# Patient Record
Sex: Male | Born: 1995 | Race: Black or African American | Hispanic: No | Marital: Single | Smoking: Current every day smoker
Health system: Southern US, Community
[De-identification: ages and names within clinical notes are randomized; demographics above are authoritative.]

## PROBLEM LIST (undated history)

## (undated) DIAGNOSIS — B182 Chronic viral hepatitis C: Secondary | ICD-10-CM

## (undated) DIAGNOSIS — E119 Type 2 diabetes mellitus without complications: Secondary | ICD-10-CM

## (undated) DIAGNOSIS — IMO0002 Reserved for concepts with insufficient information to code with codable children: Secondary | ICD-10-CM

## (undated) HISTORY — PX: TONSILLECTOMY: SUR1361

## (undated) HISTORY — PX: LEG SURGERY: SHX1003

---

## 2014-08-25 ENCOUNTER — Emergency Department (HOSPITAL_COMMUNITY)
Admission: EM | Admit: 2014-08-25 | Discharge: 2014-08-25 | Disposition: A | Discharge status: Home / Self Care | Payer: Medicaid Other | Attending: Emergency Medicine | Admitting: Emergency Medicine

## 2014-08-25 ENCOUNTER — Emergency Department (HOSPITAL_COMMUNITY): Payer: Medicaid Other

## 2014-08-25 ENCOUNTER — Encounter (HOSPITAL_COMMUNITY): Payer: Self-pay | Admitting: Emergency Medicine

## 2014-08-25 DIAGNOSIS — F17200 Nicotine dependence, unspecified, uncomplicated: Secondary | ICD-10-CM | POA: Insufficient documentation

## 2014-08-25 DIAGNOSIS — Y9239 Other specified sports and athletic area as the place of occurrence of the external cause: Secondary | ICD-10-CM | POA: Diagnosis not present

## 2014-08-25 DIAGNOSIS — Y9351 Activity, roller skating (inline) and skateboarding: Secondary | ICD-10-CM | POA: Insufficient documentation

## 2014-08-25 DIAGNOSIS — Y92838 Other recreation area as the place of occurrence of the external cause: Secondary | ICD-10-CM

## 2014-08-25 DIAGNOSIS — X500XXA Overexertion from strenuous movement or load, initial encounter: Secondary | ICD-10-CM | POA: Insufficient documentation

## 2014-08-25 DIAGNOSIS — IMO0002 Reserved for concepts with insufficient information to code with codable children: Secondary | ICD-10-CM | POA: Insufficient documentation

## 2014-08-25 DIAGNOSIS — Z880 Allergy status to penicillin: Secondary | ICD-10-CM | POA: Insufficient documentation

## 2014-08-25 DIAGNOSIS — S99929A Unspecified injury of unspecified foot, initial encounter: Secondary | ICD-10-CM

## 2014-08-25 DIAGNOSIS — S8991XA Unspecified injury of right lower leg, initial encounter: Secondary | ICD-10-CM

## 2014-08-25 DIAGNOSIS — S99919A Unspecified injury of unspecified ankle, initial encounter: Secondary | ICD-10-CM

## 2014-08-25 DIAGNOSIS — W19XXXA Unspecified fall, initial encounter: Secondary | ICD-10-CM

## 2014-08-25 DIAGNOSIS — S8990XA Unspecified injury of unspecified lower leg, initial encounter: Secondary | ICD-10-CM | POA: Insufficient documentation

## 2014-08-25 HISTORY — DX: Reserved for concepts with insufficient information to code with codable children: IMO0002

## 2014-08-25 MED ORDER — TRAMADOL HCL 50 MG PO TABS
50.0000 mg | ORAL_TABLET | Freq: Once | ORAL | Status: AC
  Administered 2014-08-25: 50 mg via ORAL
  Filled 2014-08-25: qty 1

## 2014-08-25 MED ORDER — TRAMADOL HCL 50 MG PO TABS: 50.0000 mg | ORAL_TABLET | Freq: Four times a day (QID) | ORAL | Status: AC | PRN

## 2014-08-25 NOTE — Progress Notes (Signed)
[facility] [employer],  Provided pt with a list of self-pay providers, highlighting [facility].
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
## 2014-08-25 NOTE — ED Notes (Signed)
Bed: WTR7 Expected date:  Expected time:  Means of arrival:  Comments: Ems, 18yo male, knee pain

## 2014-08-25 NOTE — ED Notes (Signed)
Per [facility]- Pt fell off skateboard onto rt knee. NO LOC. Denies neck or back pain. Only complaints was rt knee pain. Pt ambulatory on scene. Swelling with slight abrasion to knee. Tender to palpation. No splinting only elevation and ice given.

## 2014-08-25 NOTE — Discharge Instructions (Signed)
Take Tramadol as needed for pain. Rest, ice and elevate your knee. Refer to attached documents for more information.

## 2014-08-25 NOTE — ED Notes (Signed)
Pt is homeless requesting to be released before 8pm to retain placement at shelter.

## 2014-08-26 NOTE — ED Provider Notes (Signed)
Medical screening examination/treatment/procedure(s) were performed by non-physician practitioner and as supervising physician I was immediately available for consultation/collaboration.   EKG Interpretation None        Rolland Porter, MD 08/26/14 1121

## 2014-12-14 ENCOUNTER — Emergency Department (HOSPITAL_COMMUNITY)
Admission: EM | Admit: 2014-12-14 | Discharge: 2014-12-14 | Disposition: A | Discharge status: Home / Self Care | Payer: Medicaid Other | Attending: Emergency Medicine | Admitting: Emergency Medicine

## 2015-05-05 ENCOUNTER — Encounter (HOSPITAL_COMMUNITY): Payer: Self-pay | Admitting: Emergency Medicine

## 2015-05-05 ENCOUNTER — Emergency Department (HOSPITAL_COMMUNITY)
Admission: EM | Admit: 2015-05-05 | Discharge: 2015-05-05 | Disposition: A | Discharge status: Home / Self Care | Payer: Medicaid Other | Attending: Emergency Medicine | Admitting: Emergency Medicine

## 2015-05-05 DIAGNOSIS — Z202 Contact with and (suspected) exposure to infections with a predominantly sexual mode of transmission: Secondary | ICD-10-CM | POA: Insufficient documentation

## 2015-05-05 DIAGNOSIS — Z87828 Personal history of other (healed) physical injury and trauma: Secondary | ICD-10-CM | POA: Insufficient documentation

## 2015-05-05 DIAGNOSIS — Z720 Tobacco use: Secondary | ICD-10-CM | POA: Insufficient documentation

## 2015-05-05 DIAGNOSIS — A64 Unspecified sexually transmitted disease: Secondary | ICD-10-CM

## 2015-05-05 DIAGNOSIS — Z880 Allergy status to penicillin: Secondary | ICD-10-CM | POA: Insufficient documentation

## 2015-05-05 MED ORDER — CEFTRIAXONE SODIUM 250 MG IJ SOLR
250.0000 mg | Freq: Once | INTRAMUSCULAR | Status: AC
  Administered 2015-05-05: 250 mg via INTRAMUSCULAR
  Filled 2015-05-05: qty 250

## 2015-05-05 MED ORDER — AZITHROMYCIN 250 MG PO TABS
1000.0000 mg | ORAL_TABLET | Freq: Once | ORAL | Status: AC
  Administered 2015-05-05: 1000 mg via ORAL
  Filled 2015-05-05: qty 4
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
## 2015-05-05 NOTE — ED Notes (Signed)
Pt st's he needs to be checked for STD.  C/o discharge from penis this am

## 2015-05-05 NOTE — Discharge Instructions (Signed)
Sexually Transmitted Disease A sexually transmitted disease (STD) is a disease or infection often passed to another person during sex. However, STDs can be passed through nonsexual ways. An STD can be passed through:  Spit (saliva).  Semen.  Blood.  Mucus from the vagina.  Pee (urine). HOW CAN I LESSEN MY CHANCES OF GETTING AN STD?  Use:  Latex condoms.  Water-soluble lubricants with condoms. Do not use petroleum jelly or oils.  Dental dams. These are small pieces of latex that are used as a barrier during oral sex.  Avoid having more than one sex partner.  Do not have sex with someone who has other sex partners.  Do not have sex with anyone you do not know or who is at high risk for an STD.  Avoid risky sex that can break your skin.  Do not have sex if you have open sores on your mouth or skin.  Avoid drinking too much alcohol or taking illegal drugs. Alcohol and drugs can affect your good judgment.  Avoid oral and anal sex acts.  Get shots (vaccines) for HPV and hepatitis.  If you are at risk of being infected with HIV, it is advised that you take a certain medicine daily to prevent HIV infection. This is called pre-exposure prophylaxis (PrEP). You may be at risk if:  You are a man who has sex with other men (MSM).  You are attracted to the opposite sex (heterosexual) and are having sex with more than one partner.  You take drugs with a needle.  You have sex with someone who has HIV.  Talk with your doctor about if you are at high risk of being infected with HIV. If you begin to take PrEP, get tested for HIV first. Get tested every 3 months for as long as you are taking PrEP. WHAT SHOULD I DO IF I THINK I HAVE AN STD?  See your doctor.  Tell your sex partner(s) that you have an STD. They should be tested and treated.  Do not have sex until your doctor says it is okay. WHEN SHOULD I GET HELP? Get help right away if:  You have bad belly (abdominal)  pain.  You are a man and have puffiness (swelling) or pain in your testicles.  You are a woman and have puffiness in your vagina. Document Released: 12/26/2004 Document Revised: 11/23/2013 Document Reviewed: 05/14/2013 ExitCare Patient Information 2015 [employer]. This information is not intended to replace advice given to you by your health care provider. Make sure you discuss any questions you have with your health care provider.  

## 2015-05-06 LAB — HIV ANTIBODY (ROUTINE TESTING W REFLEX): HIV Screen 4th Generation wRfx: NONREACTIVE

## 2015-05-06 LAB — RPR: RPR: NONREACTIVE

## 2015-08-05 ENCOUNTER — Encounter (HOSPITAL_COMMUNITY): Payer: Self-pay | Admitting: Emergency Medicine

## 2015-08-05 ENCOUNTER — Emergency Department (HOSPITAL_COMMUNITY)
Admission: EM | Admit: 2015-08-05 | Discharge: 2015-08-05 | Disposition: A | Discharge status: Home / Self Care | Payer: Medicaid Other | Attending: Emergency Medicine | Admitting: Emergency Medicine

## 2015-08-05 DIAGNOSIS — Z8619 Personal history of other infectious and parasitic diseases: Secondary | ICD-10-CM | POA: Insufficient documentation

## 2015-08-05 DIAGNOSIS — Z880 Allergy status to penicillin: Secondary | ICD-10-CM | POA: Insufficient documentation

## 2015-08-05 DIAGNOSIS — R740 Nonspecific elevation of levels of transaminase and lactic acid dehydrogenase [LDH]: Secondary | ICD-10-CM | POA: Insufficient documentation

## 2015-08-05 DIAGNOSIS — R110 Nausea: Secondary | ICD-10-CM | POA: Insufficient documentation

## 2015-08-05 DIAGNOSIS — Z720 Tobacco use: Secondary | ICD-10-CM | POA: Insufficient documentation

## 2015-08-05 DIAGNOSIS — E119 Type 2 diabetes mellitus without complications: Secondary | ICD-10-CM | POA: Insufficient documentation

## 2015-08-05 DIAGNOSIS — R1011 Right upper quadrant pain: Secondary | ICD-10-CM | POA: Insufficient documentation

## 2015-08-05 DIAGNOSIS — R7401 Elevation of levels of liver transaminase levels: Secondary | ICD-10-CM

## 2015-08-05 HISTORY — DX: Chronic viral hepatitis C: B18.2

## 2015-08-05 HISTORY — DX: Type 2 diabetes mellitus without complications: E11.9

## 2015-08-05 LAB — COMPREHENSIVE METABOLIC PANEL
ALT: 191 U/L — ABNORMAL HIGH (ref 17–63)
AST: 90 U/L — AB (ref 15–41)
Albumin: 4.2 g/dL (ref 3.5–5.0)
Alkaline Phosphatase: 79 U/L (ref 38–126)
Anion gap: 5 (ref 5–15)
BUN: 14 mg/dL (ref 6–20)
CALCIUM: 9.6 mg/dL (ref 8.9–10.3)
CHLORIDE: 103 mmol/L (ref 101–111)
CO2: 27 mmol/L (ref 22–32)
Creatinine, Ser: 0.93 mg/dL (ref 0.61–1.24)
GFR calc Af Amer: >60 mL/min (ref >60)
GFR calc non Af Amer: >60 mL/min (ref >60)
Glucose, Bld: 113 mg/dL — ABNORMAL HIGH (ref 65–99)
Potassium: 3.9 mmol/L (ref 3.5–5.1)
SODIUM: 135 mmol/L (ref 135–145)
Total Bilirubin: 1 mg/dL (ref 0.3–1.2)
Total Protein: 7 g/dL (ref 6.5–8.1)

## 2015-08-05 LAB — URINALYSIS, ROUTINE W REFLEX MICROSCOPIC
Bilirubin Urine: NEGATIVE
GLUCOSE, UA: NEGATIVE mg/dL
HGB URINE DIPSTICK: NEGATIVE
Ketones, ur: NEGATIVE mg/dL
Nitrite: NEGATIVE
PH: 7 (ref 5.0–8.0)
PROTEIN: NEGATIVE mg/dL
Specific Gravity, Urine: 1.017 (ref 1.005–1.030)
Urobilinogen, UA: 1 mg/dL (ref 0.0–1.0)

## 2015-08-05 LAB — LIPASE, BLOOD: LIPASE: 20 U/L — AB (ref 22–51)

## 2015-08-05 LAB — CBC
HCT: 43.8 % (ref 39.0–52.0)
HEMOGLOBIN: 15 g/dL (ref 13.0–17.0)
MCH: 30.9 pg (ref 26.0–34.0)
MCHC: 34.2 g/dL (ref 30.0–36.0)
MCV: 90.3 fL (ref 78.0–100.0)
Platelets: 229 10*3/uL (ref 150–400)
RBC: 4.85 MIL/uL (ref 4.22–5.81)
RDW: 12.5 % (ref 11.5–15.5)
WBC: 5.7 10*3/uL (ref 4.0–10.5)

## 2015-08-05 LAB — URINE MICROSCOPIC-ADD ON

## 2015-08-05 MED ORDER — LIDOCAINE VISCOUS 2 % MT SOLN
15.0000 mL | Freq: Once | OROMUCOSAL | Status: AC
  Administered 2015-08-05: 15 mL via OROMUCOSAL
  Filled 2015-08-05: qty 15

## 2015-08-05 MED ORDER — ONDANSETRON HCL 4 MG/2ML IJ SOLN
4.0000 mg | Freq: Once | INTRAMUSCULAR | Status: DC
Start: 2015-08-05 — End: 2015-08-05

## 2015-08-05 MED ORDER — IBUPROFEN 800 MG PO TABS
800.0000 mg | ORAL_TABLET | Freq: Once | ORAL | Status: AC
  Administered 2015-08-05: 800 mg via ORAL
  Filled 2015-08-05: qty 1

## 2015-08-05 MED ORDER — ALUM & MAG HYDROXIDE-SIMETH 200-200-20 MG/5ML PO SUSP
15.0000 mL | Freq: Once | ORAL | Status: AC
  Administered 2015-08-05: 15 mL via ORAL
  Filled 2015-08-05: qty 30

## 2015-08-05 MED ORDER — ONDANSETRON 4 MG PO TBDP
4.0000 mg | ORAL_TABLET | Freq: Once | ORAL | Status: AC
  Administered 2015-08-05: 4 mg via ORAL
  Filled 2015-08-05: qty 1

## 2015-08-05 NOTE — ED Notes (Signed)
Pt c/o abdominal pain with nausea onset couple days ago. Pt has history of Hep C.
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
## 2015-08-05 NOTE — ED Notes (Signed)
Pt remains monitored by blood pressure and pulse ox. Pts family remains at bedside.  

## 2015-08-05 NOTE — ED Notes (Signed)
EDP at bedside  

## 2015-08-05 NOTE — Discharge Instructions (Signed)

## 2015-08-05 NOTE — ED Notes (Signed)
Patient does not need to urinate at this time.

## 2015-10-23 ENCOUNTER — Emergency Department (HOSPITAL_COMMUNITY)
Admission: EM | Admit: 2015-10-23 | Discharge: 2015-10-23 | Disposition: A | Discharge status: Home / Self Care | Payer: Medicaid Other | Attending: Emergency Medicine | Admitting: Emergency Medicine

## 2015-10-23 ENCOUNTER — Encounter (HOSPITAL_COMMUNITY): Payer: Self-pay | Admitting: Emergency Medicine

## 2015-10-23 DIAGNOSIS — F17210 Nicotine dependence, cigarettes, uncomplicated: Secondary | ICD-10-CM | POA: Insufficient documentation

## 2015-10-23 DIAGNOSIS — Z880 Allergy status to penicillin: Secondary | ICD-10-CM | POA: Insufficient documentation

## 2015-10-23 DIAGNOSIS — E119 Type 2 diabetes mellitus without complications: Secondary | ICD-10-CM | POA: Insufficient documentation

## 2015-10-23 DIAGNOSIS — Z202 Contact with and (suspected) exposure to infections with a predominantly sexual mode of transmission: Secondary | ICD-10-CM | POA: Insufficient documentation

## 2015-10-23 DIAGNOSIS — Z711 Person with feared health complaint in whom no diagnosis is made: Secondary | ICD-10-CM

## 2015-10-23 DIAGNOSIS — Z8619 Personal history of other infectious and parasitic diseases: Secondary | ICD-10-CM | POA: Insufficient documentation

## 2015-10-23 LAB — URINALYSIS, ROUTINE W REFLEX MICROSCOPIC
Bilirubin Urine: NEGATIVE
GLUCOSE, UA: NEGATIVE mg/dL
Hgb urine dipstick: NEGATIVE
KETONES UR: NEGATIVE mg/dL
NITRITE: NEGATIVE
PH: 7.5 (ref 5.0–8.0)
PROTEIN: NEGATIVE mg/dL
SPECIFIC GRAVITY, URINE: 1.021 (ref 1.005–1.030)

## 2015-10-23 LAB — URINE MICROSCOPIC-ADD ON

## 2015-10-23 MED ORDER — AZITHROMYCIN 250 MG PO TABS
1000.0000 mg | ORAL_TABLET | Freq: Once | ORAL | Status: AC
  Administered 2015-10-23: 1000 mg via ORAL
  Filled 2015-10-23: qty 4

## 2015-10-23 MED ORDER — METRONIDAZOLE 500 MG PO TABS
2000.0000 mg | ORAL_TABLET | Freq: Once | ORAL | Status: AC
  Administered 2015-10-23: 2000 mg via ORAL
  Filled 2015-10-23: qty 4

## 2015-10-23 MED ORDER — CEFTRIAXONE SODIUM 250 MG IJ SOLR
250.0000 mg | Freq: Once | INTRAMUSCULAR | Status: AC
  Administered 2015-10-23: 250 mg via INTRAMUSCULAR
  Filled 2015-10-23: qty 250

## 2015-10-23 MED ORDER — LIDOCAINE HCL (PF) 1 % IJ SOLN
INTRAMUSCULAR | Status: AC
  Administered 2015-10-23: 5 mL
  Filled 2015-10-23: qty 5

## 2015-10-23 NOTE — ED Notes (Signed)
Pt. reports yellow penile discharge onset 2 weeks ago , denies dysuria /no fever .

## 2015-10-23 NOTE — Discharge Instructions (Signed)
Sexually Transmitted Disease °A sexually transmitted disease (STD) is a disease or infection that may be passed (transmitted) from person to person, usually during sexual activity. This may happen by way of saliva, semen, blood, vaginal mucus, or urine. Common STDs include: °· Gonorrhea. °· Chlamydia. °· Syphilis. °· HIV and AIDS. °· Genital herpes. °· Hepatitis B and C. °· Trichomonas. °· Human papillomavirus (HPV). °· Pubic lice. °· Scabies. °· Mites. °· Bacterial vaginosis. °WHAT ARE CAUSES OF STDs? °An STD may be caused by bacteria, a virus, or parasites. STDs are often transmitted during sexual activity if one person is infected. However, they may also be transmitted through nonsexual means. STDs may be transmitted after:  °· Sexual intercourse with an infected person. °· Sharing sex toys with an infected person. °· Sharing needles with an infected person or using unclean piercing or tattoo needles. °· Having intimate contact with the genitals, mouth, or rectal areas of an infected person. °· Exposure to infected fluids during birth. °WHAT ARE THE SIGNS AND SYMPTOMS OF STDs? °Different STDs have different symptoms. Some people may not have any symptoms. If symptoms are present, they may include: °· Painful or bloody urination. °· Pain in the pelvis, abdomen, vagina, anus, throat, or eyes. °· A skin rash, itching, or irritation. °· Growths, ulcerations, blisters, or sores in the genital and anal areas. °· Abnormal vaginal discharge with or without bad odor. °· Penile discharge in men. °· Fever. °· Pain or bleeding during sexual intercourse. °· Swollen glands in the groin area. °· Yellow skin and eyes (jaundice). This is seen with hepatitis. °· Swollen testicles. °· Infertility. °· Sores and blisters in the mouth. °HOW ARE STDs DIAGNOSED? °To make a diagnosis, your health care provider may: °· Take a medical history. °· Perform a physical exam. °· Take a sample of any discharge to examine. °· Swab the throat,  cervix, opening to the penis, rectum, or vagina for testing. °· Test a sample of your first morning urine. °· Perform blood tests. °· Perform a Pap test, if this applies. °· Perform a colposcopy. °· Perform a laparoscopy. °HOW ARE STDs TREATED? °Treatment depends on the STD. Some STDs may be treated but not cured. °· Chlamydia, gonorrhea, trichomonas, and syphilis can be cured with antibiotic medicine. °· Genital herpes, hepatitis, and HIV can be treated, but not cured, with prescribed medicines. The medicines lessen symptoms. °· Genital warts from HPV can be treated with medicine or by freezing, burning (electrocautery), or surgery. Warts may come back. °· HPV cannot be cured with medicine or surgery. However, abnormal areas may be removed from the cervix, vagina, or vulva. °· If your diagnosis is confirmed, your recent sexual partners need treatment. This is true even if they are symptom-free or have a negative culture or evaluation. They should not have sex until their health care providers say it is okay. °· Your health care provider may test you for infection again 3 months after treatment. °HOW CAN I REDUCE MY RISK OF GETTING AN STD? °Take these steps to reduce your risk of getting an STD: °· Use latex condoms, dental dams, and water-soluble lubricants during sexual activity. Do not use petroleum jelly or oils. °· Avoid having multiple sex partners. °· Do not have sex with someone who has other sex partners °· Do not have sex with anyone you do not know or who is at high risk for an STD. °· Avoid risky sex practices that can break your skin. °· Do not have sex   if you have open sores on your mouth or skin. °· Avoid drinking too much alcohol or taking illegal drugs. Alcohol and drugs can affect your judgment and put you in a vulnerable position. °· Avoid engaging in oral and anal sex acts. °· Get vaccinated for HPV and hepatitis. If you have not received these vaccines in the past, talk to your health care  provider about whether one or both might be right for you. °· If you are at risk of being infected with HIV, it is recommended that you take a prescription medicine daily to prevent HIV infection. This is called pre-exposure prophylaxis (PrEP). You are considered at risk if: °¨ You are a man who has sex with other men (MSM). °¨ You are a heterosexual man or woman and are sexually active with more than one partner. °¨ You take drugs by injection. °¨ You are sexually active with a partner who has HIV. °· Talk with your health care provider about whether you are at high risk of being infected with HIV. If you choose to begin PrEP, you should first be tested for HIV. You should then be tested every 3 months for as long as you are taking PrEP. °WHAT SHOULD I DO IF I THINK I HAVE AN STD? °· See your health care provider. °· Tell your sexual partner(s). They should be tested and treated for any STDs. °· Do not have sex until your health care provider says it is okay. °WHEN SHOULD I GET IMMEDIATE MEDICAL CARE? °Contact your health care provider right away if:  °· You have severe abdominal pain. °· You are a man and notice swelling or pain in your testicles. °· You are a woman and notice swelling or pain in your vagina. °  °This information is not intended to replace advice given to you by your health care provider. Make sure you discuss any questions you have with your health care provider. °  °Document Released: 02/08/2003 Document Revised: 12/09/2014 Document Reviewed: 06/08/2013 °[employer]vier Interactive Patient Education ©2016 [employer] ° °
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
## 2015-10-24 LAB — HIV ANTIBODY (ROUTINE TESTING W REFLEX): HIV SCREEN 4TH GENERATION: NONREACTIVE

## 2015-10-24 LAB — RPR: RPR Ser Ql: NONREACTIVE

## 2015-10-25 LAB — GC/CHLAMYDIA PROBE AMP (~~LOC~~) NOT AT ARMC
Chlamydia: POSITIVE — AB
Neisseria Gonorrhea: NEGATIVE

## 2015-10-27 ENCOUNTER — Telehealth (HOSPITAL_BASED_OUTPATIENT_CLINIC_OR_DEPARTMENT_OTHER): Payer: Self-pay | Admitting: Emergency Medicine

## 2015-10-27 NOTE — Telephone Encounter (Signed)
+   Chlamydia, was treated while in ED, attempting to notify the patient

## 2015-10-29 ENCOUNTER — Telehealth (HOSPITAL_BASED_OUTPATIENT_CLINIC_OR_DEPARTMENT_OTHER): Payer: Self-pay | Admitting: Emergency Medicine

## 2015-11-07 ENCOUNTER — Telehealth (HOSPITAL_BASED_OUTPATIENT_CLINIC_OR_DEPARTMENT_OTHER): Payer: Self-pay | Admitting: Emergency Medicine

## 2016-01-01 ENCOUNTER — Emergency Department (HOSPITAL_COMMUNITY)
Admission: EM | Admit: 2016-01-01 | Discharge: 2016-01-01 | Disposition: A | Discharge status: Other Acute Inpatient Hospital | Payer: Medicaid Other | Attending: Emergency Medicine | Admitting: Emergency Medicine

## 2016-01-01 ENCOUNTER — Emergency Department (HOSPITAL_COMMUNITY): Payer: Medicaid Other

## 2016-01-01 ENCOUNTER — Inpatient Hospital Stay
Admission: EM | Admit: 2016-01-01 | Discharge: 2016-01-05 | DRG: 885 | Disposition: A | Discharge status: Home / Self Care | Payer: Medicaid Other | Source: Intra-hospital | Attending: Psychiatry | Admitting: Psychiatry

## 2016-01-01 ENCOUNTER — Encounter (HOSPITAL_COMMUNITY): Payer: Self-pay | Admitting: *Deleted

## 2016-01-01 DIAGNOSIS — Y9389 Activity, other specified: Secondary | ICD-10-CM | POA: Diagnosis not present

## 2016-01-01 DIAGNOSIS — S71131A Puncture wound without foreign body, right thigh, initial encounter: Secondary | ICD-10-CM | POA: Insufficient documentation

## 2016-01-01 DIAGNOSIS — X781XXA Intentional self-harm by knife, initial encounter: Secondary | ICD-10-CM | POA: Diagnosis present

## 2016-01-01 DIAGNOSIS — G4700 Insomnia, unspecified: Secondary | ICD-10-CM | POA: Diagnosis present

## 2016-01-01 DIAGNOSIS — F1590 Other stimulant use, unspecified, uncomplicated: Secondary | ICD-10-CM | POA: Diagnosis present

## 2016-01-01 DIAGNOSIS — E119 Type 2 diabetes mellitus without complications: Secondary | ICD-10-CM | POA: Insufficient documentation

## 2016-01-01 DIAGNOSIS — F322 Major depressive disorder, single episode, severe without psychotic features: Principal | ICD-10-CM | POA: Diagnosis present

## 2016-01-01 DIAGNOSIS — Z23 Encounter for immunization: Secondary | ICD-10-CM | POA: Diagnosis not present

## 2016-01-01 DIAGNOSIS — R45851 Suicidal ideations: Secondary | ICD-10-CM | POA: Diagnosis present

## 2016-01-01 DIAGNOSIS — Z880 Allergy status to penicillin: Secondary | ICD-10-CM | POA: Insufficient documentation

## 2016-01-01 DIAGNOSIS — Z8659 Personal history of other mental and behavioral disorders: Secondary | ICD-10-CM | POA: Diagnosis not present

## 2016-01-01 DIAGNOSIS — F1210 Cannabis abuse, uncomplicated: Secondary | ICD-10-CM

## 2016-01-01 DIAGNOSIS — R7303 Prediabetes: Secondary | ICD-10-CM | POA: Diagnosis present

## 2016-01-01 DIAGNOSIS — F17210 Nicotine dependence, cigarettes, uncomplicated: Secondary | ICD-10-CM | POA: Diagnosis present

## 2016-01-01 DIAGNOSIS — Y998 Other external cause status: Secondary | ICD-10-CM | POA: Diagnosis not present

## 2016-01-01 DIAGNOSIS — Z818 Family history of other mental and behavioral disorders: Secondary | ICD-10-CM | POA: Diagnosis not present

## 2016-01-01 DIAGNOSIS — T148XXA Other injury of unspecified body region, initial encounter: Secondary | ICD-10-CM

## 2016-01-01 DIAGNOSIS — F1290 Cannabis use, unspecified, uncomplicated: Secondary | ICD-10-CM | POA: Diagnosis present

## 2016-01-01 DIAGNOSIS — F4310 Post-traumatic stress disorder, unspecified: Secondary | ICD-10-CM | POA: Diagnosis present

## 2016-01-01 DIAGNOSIS — R440 Auditory hallucinations: Secondary | ICD-10-CM | POA: Diagnosis present

## 2016-01-01 DIAGNOSIS — F603 Borderline personality disorder: Secondary | ICD-10-CM | POA: Diagnosis present

## 2016-01-01 DIAGNOSIS — Z62810 Personal history of physical and sexual abuse in childhood: Secondary | ICD-10-CM | POA: Diagnosis present

## 2016-01-01 DIAGNOSIS — Y9289 Other specified places as the place of occurrence of the external cause: Secondary | ICD-10-CM | POA: Diagnosis not present

## 2016-01-01 DIAGNOSIS — F29 Unspecified psychosis not due to a substance or known physiological condition: Secondary | ICD-10-CM | POA: Diagnosis present

## 2016-01-01 DIAGNOSIS — T1491XA Suicide attempt, initial encounter: Secondary | ICD-10-CM

## 2016-01-01 DIAGNOSIS — F1190 Opioid use, unspecified, uncomplicated: Secondary | ICD-10-CM | POA: Diagnosis present

## 2016-01-01 DIAGNOSIS — B1920 Unspecified viral hepatitis C without hepatic coma: Secondary | ICD-10-CM | POA: Diagnosis present

## 2016-01-01 LAB — URINALYSIS, ROUTINE W REFLEX MICROSCOPIC
BILIRUBIN URINE: NEGATIVE
Glucose, UA: NEGATIVE mg/dL
Ketones, ur: NEGATIVE mg/dL
Leukocytes, UA: NEGATIVE
Nitrite: NEGATIVE
PH: 6 (ref 5.0–8.0)
Protein, ur: NEGATIVE mg/dL
SPECIFIC GRAVITY, URINE: 1.024 (ref 1.005–1.030)

## 2016-01-01 LAB — COMPREHENSIVE METABOLIC PANEL
ALBUMIN: 4.5 g/dL (ref 3.5–5.0)
ALT: 206 U/L — ABNORMAL HIGH (ref 17–63)
ANION GAP: 14 (ref 5–15)
AST: 93 U/L — AB (ref 15–41)
Alkaline Phosphatase: 78 U/L (ref 38–126)
BILIRUBIN TOTAL: 0.9 mg/dL (ref 0.3–1.2)
BUN: 11 mg/dL (ref 6–20)
CHLORIDE: 102 mmol/L (ref 101–111)
CO2: 27 mmol/L (ref 22–32)
Calcium: 10 mg/dL (ref 8.9–10.3)
Creatinine, Ser: 1.06 mg/dL (ref 0.61–1.24)
GFR calc Af Amer: >60 mL/min (ref >60)
GFR calc non Af Amer: >60 mL/min (ref >60)
GLUCOSE: 144 mg/dL — AB (ref 65–99)
POTASSIUM: 3.6 mmol/L (ref 3.5–5.1)
SODIUM: 143 mmol/L (ref 135–145)
TOTAL PROTEIN: 7.6 g/dL (ref 6.5–8.1)

## 2016-01-01 LAB — RAPID URINE DRUG SCREEN, HOSP PERFORMED
Amphetamines: NOT DETECTED
BARBITURATES: NOT DETECTED
BENZODIAZEPINES: NOT DETECTED
COCAINE: NOT DETECTED
Opiates: NOT DETECTED
TETRAHYDROCANNABINOL: NOT DETECTED

## 2016-01-01 LAB — CBC WITH DIFFERENTIAL/PLATELET
BASOS PCT: 0 %
Basophils Absolute: 0 10*3/uL (ref 0.0–0.1)
Eosinophils Absolute: 0 10*3/uL (ref 0.0–0.7)
Eosinophils Relative: 0 %
HEMATOCRIT: 42.9 % (ref 39.0–52.0)
HEMOGLOBIN: 14.9 g/dL (ref 13.0–17.0)
LYMPHS PCT: 16 %
Lymphs Abs: 1.4 10*3/uL (ref 0.7–4.0)
MCH: 31.4 pg (ref 26.0–34.0)
MCHC: 34.7 g/dL (ref 30.0–36.0)
MCV: 90.5 fL (ref 78.0–100.0)
MONOS PCT: 5 %
Monocytes Absolute: 0.4 10*3/uL (ref 0.1–1.0)
NEUTROS ABS: 7.3 10*3/uL (ref 1.7–7.7)
NEUTROS PCT: 79 %
Platelets: 211 10*3/uL (ref 150–400)
RBC: 4.74 MIL/uL (ref 4.22–5.81)
RDW: 12.8 % (ref 11.5–15.5)
WBC: 9.2 10*3/uL (ref 4.0–10.5)

## 2016-01-01 LAB — SALICYLATE LEVEL

## 2016-01-01 LAB — ACETAMINOPHEN LEVEL: Acetaminophen (Tylenol), Serum: <10 ug/mL — ABNORMAL LOW (ref 10–30)

## 2016-01-01 LAB — URINE MICROSCOPIC-ADD ON: Squamous Epithelial / LPF: NONE SEEN

## 2016-01-01 LAB — CBG MONITORING, ED: Glucose-Capillary: 124 mg/dL — ABNORMAL HIGH (ref 65–99)

## 2016-01-01 LAB — ETHANOL: Alcohol, Ethyl (B): <5 mg/dL (ref <5)

## 2016-01-01 MED ORDER — ALUM & MAG HYDROXIDE-SIMETH 200-200-20 MG/5ML PO SUSP
30.0000 mL | ORAL | Status: DC | PRN
Start: 2016-01-01 — End: 2016-01-05

## 2016-01-01 MED ORDER — MAGNESIUM HYDROXIDE 400 MG/5ML PO SUSP: 30.0000 mL | Freq: Every day | ORAL | Status: DC | PRN

## 2016-01-01 MED ORDER — TRAZODONE HCL 100 MG PO TABS: 100.0000 mg | ORAL_TABLET | Freq: Every evening | ORAL | Status: DC | PRN

## 2016-01-01 MED ORDER — ACETAMINOPHEN 325 MG PO TABS
650.0000 mg | ORAL_TABLET | Freq: Four times a day (QID) | ORAL | Status: DC | PRN
  Administered 2016-01-01 – 2016-01-03 (×3): 650 mg via ORAL
  Filled 2016-01-01 (×3): qty 2

## 2016-01-01 MED ORDER — TETANUS-DIPHTH-ACELL PERTUSSIS 5-2.5-18.5 LF-MCG/0.5 IM SUSP
INTRAMUSCULAR | Status: AC
  Filled 2016-01-01: qty 0.5

## 2016-01-01 MED ORDER — LIDOCAINE-EPINEPHRINE 1 %-1:100000 IJ SOLN
20.0000 mL | Freq: Once | INTRAMUSCULAR | Status: AC
  Administered 2016-01-01: 20 mL via INTRADERMAL
  Filled 2016-01-01: qty 1

## 2016-01-01 MED ORDER — TETANUS-DIPHTH-ACELL PERTUSSIS 5-2.5-18.5 LF-MCG/0.5 IM SUSP
0.5000 mL | Freq: Once | INTRAMUSCULAR | Status: AC
  Administered 2016-01-01: 0.5 mL via INTRAMUSCULAR

## 2016-01-01 NOTE — ED Notes (Signed)
[facility] department states will transport.

## 2016-01-01 NOTE — BH Assessment (Signed)
Patient has been accepted to [facility].  Accepting physician is Dr. Ardyth Harps.  Attending Physician will be Dr. Ardyth Harps.  Patient has been assigned to room 325, by [facility] Charge Nurse [place].  Call report to [phone].  Representative/Transfer Coordinator is Megin Consalvo.   [facility] ER Staff (Meghn, TTS/Soical Worker) made aware of acceptance. Segundo Jesse Sans, Patient's Nurse) is aware of the admission

## 2016-01-01 NOTE — ED Notes (Signed)
Dinner Delivered 

## 2016-01-01 NOTE — ED Notes (Signed)
Resident at bedside to suture pts leg

## 2016-01-01 NOTE — ED Notes (Signed)
No belongings brought to Pod C with patient.

## 2016-01-01 NOTE — ED Notes (Signed)
TTS on Monitor

## 2016-01-01 NOTE — Tx Team (Signed)
Initial Interdisciplinary Treatment Plan   PATIENT STRESSORS: Financial difficulties   PATIENT STRENGTHS: Ability for insight General fund of knowledge Physical Health   PROBLEM LIST: Problem List/Patient Goals Date to be addressed Date deferred Reason deferred Estimated date of resolution  Suicidal Attempt 01/01/16     Depression 01/01/16                                                DISCHARGE CRITERIA:  Improved stabilization in mood, thinking, and/or behavior  PRELIMINARY DISCHARGE PLAN: Outpatient therapy  PATIENT/FAMIILY INVOLVEMENT: This treatment plan has been presented to and reviewed with the patient, Alexander Drake, and/or family member.  The patient and family have been given the opportunity to ask questions and make suggestions.  [name] 01/01/2016, 10:37 PM

## 2016-01-01 NOTE — ED Notes (Signed)
Pt belongings placed in brown bag in room. [employer] made aware, will remain in room until in [employer] possession.   Blue hoodie, camo pants (cut), blue jean pants, grey tshirt, white tennis shoes, 1 black sock, 3 nickels, 8 dimes.

## 2016-01-01 NOTE — ED Notes (Signed)
Sheriff taking custody of pt. Pt ambulatory, NAD.

## 2016-01-01 NOTE — ED Notes (Signed)
Sitter at bedside.

## 2016-01-01 NOTE — ED Notes (Signed)
IVC paperwork given to [place], Charity fundraiser. RN instructed this NT to place on paperwork on clipboard. 3 copies of IVC paperwork as well as original copy placed on clipboard designated for room C21.
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
## 2016-01-01 NOTE — ED Notes (Signed)
Patient placed 2  Phone call.

## 2016-01-02 ENCOUNTER — Encounter: Payer: Self-pay | Admitting: Psychiatry

## 2016-01-02 DIAGNOSIS — R7303 Prediabetes: Secondary | ICD-10-CM

## 2016-01-02 DIAGNOSIS — F322 Major depressive disorder, single episode, severe without psychotic features: Secondary | ICD-10-CM

## 2016-01-02 DIAGNOSIS — F4310 Post-traumatic stress disorder, unspecified: Secondary | ICD-10-CM

## 2016-01-02 DIAGNOSIS — F1210 Cannabis abuse, uncomplicated: Secondary | ICD-10-CM

## 2016-01-02 DIAGNOSIS — F603 Borderline personality disorder: Secondary | ICD-10-CM

## 2016-01-02 LAB — COMPREHENSIVE METABOLIC PANEL
ALK PHOS: 78 U/L (ref 38–126)
ALT: 190 U/L — ABNORMAL HIGH (ref 17–63)
ANION GAP: 8 (ref 5–15)
AST: 77 U/L — ABNORMAL HIGH (ref 15–41)
Albumin: 4.6 g/dL (ref 3.5–5.0)
BILIRUBIN TOTAL: 1.4 mg/dL — AB (ref 0.3–1.2)
BUN: 14 mg/dL (ref 6–20)
CALCIUM: 10 mg/dL (ref 8.9–10.3)
CO2: 27 mmol/L (ref 22–32)
Chloride: 104 mmol/L (ref 101–111)
Creatinine, Ser: 0.85 mg/dL (ref 0.61–1.24)
GFR calc non Af Amer: >60 mL/min (ref >60)
Glucose, Bld: 131 mg/dL — ABNORMAL HIGH (ref 65–99)
POTASSIUM: 4.4 mmol/L (ref 3.5–5.1)
SODIUM: 139 mmol/L (ref 135–145)
TOTAL PROTEIN: 8.3 g/dL — AB (ref 6.5–8.1)

## 2016-01-02 LAB — LIPID PANEL
CHOLESTEROL: 123 mg/dL (ref 0–200)
HDL: 38 mg/dL — AB (ref >40)
LDL Cholesterol: 75 mg/dL (ref 0–99)
TRIGLYCERIDES: 48 mg/dL (ref <150)
Total CHOL/HDL Ratio: 3.2 RATIO
VLDL: 10 mg/dL (ref 0–40)

## 2016-01-02 LAB — CBC
HCT: 43.4 % (ref 40.0–52.0)
HEMOGLOBIN: 14.5 g/dL (ref 13.0–18.0)
MCH: 30.2 pg (ref 26.0–34.0)
MCHC: 33.4 g/dL (ref 32.0–36.0)
MCV: 90.3 fL (ref 80.0–100.0)
Platelets: 224 10*3/uL (ref 150–440)
RBC: 4.8 MIL/uL (ref 4.40–5.90)
RDW: 13.4 % (ref 11.5–14.5)
WBC: 6.6 10*3/uL (ref 3.8–10.6)

## 2016-01-02 LAB — HEMOGLOBIN A1C: Hgb A1c MFr Bld: 6.1 % — ABNORMAL HIGH (ref 4.0–6.0)

## 2016-01-02 LAB — TSH: TSH: 0.753 u[IU]/mL (ref 0.350–4.500)

## 2016-01-02 MED ORDER — FLUOXETINE HCL 10 MG PO CAPS
10.0000 mg | ORAL_CAPSULE | Freq: Every day | ORAL | Status: DC
  Administered 2016-01-02 – 2016-01-05 (×4): 10 mg via ORAL
  Filled 2016-01-02 (×4): qty 1

## 2016-01-02 MED ORDER — TRAZODONE HCL 100 MG PO TABS
100.0000 mg | ORAL_TABLET | Freq: Every day | ORAL | Status: DC
  Administered 2016-01-02: 100 mg via ORAL
  Filled 2016-01-02: qty 1
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
## 2016-01-02 NOTE — Progress Notes (Signed)
Recreation Therapy Notes  Date: 01.31.17 Time: 3:00 pm Location: Craft Room  Group Topic: Goal Setting  Goal Area(s) Addresses:  Patient will write at least one goal. Patient will write at least one obstacle.  Behavioral Response: Attentive, Interactive  Intervention: Recovery Goal Chart  Activity: Patients were instructed to make a Recovery Goal Chart including goals, obstacles, the date they started working on their goals, and the date they achieved their goals.  Education: LRT educated patients on ways they can celebrate reaching their goals.  Education Outcome: In group clarification offered  Clinical Observations/Feedback: Patient completed activity by writing goals and obstacles. Patient contributed to group discussion.  Jacquelynn Cree, LRT/CTRS 01/02/2016 4:37 PM
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
## 2016-01-02 NOTE — Plan of Care (Signed)
Problem: Diagnosis: Increased Risk For Suicide Attempt Goal: LTG-Patient Will Report Improvement in Psychotic Symptoms LTG (by discharge) : Patient will report improvement in psychotic symptoms.  Outcome: Progressing Patient reported less auditory hallucinations this am

## 2016-01-02 NOTE — Progress Notes (Signed)
Recreation Therapy Notes  INPATIENT RECREATION THERAPY ASSESSMENT  Patient Details Name: Alexander Drake MRN: [id number] DOB: 18-Dec-1995 Today's Date: 01/02/2016  Patient Stressors: Family (Family says they are there for him, but they are not)  Coping Skills:   Isolate, Arguments, Avoidance, Self-Injury, Exercise, Art/Dance, Music, Sports  Personal Challenges: Anger, Communication, Concentration, Decision-Making, Expressing Yourself, Problem-Solving, Relationships, Self-Esteem/Confidence, Social Interaction, Stress Management, Time Management, Trusting Others  Leisure Interests (2+):  Individual - Other (Comment) (Read medical books, spend time with family)  Awareness of Community Resources:  Yes  Community Resources:  [employer]  Current Use: No  If no, Barriers?: Other (Comment) (No time)  Patient Strengths:  Nothing  Patient Identified Areas of Improvement:  Self-esteem, behavior-depression and anger  Current Recreation Participation:  Nothing  Patient Goal for Hospitalization:  To get help  [place] of Residence:  [place] of Residence:  [place]   Current [place] (including self-harm):  No  Current HI:  No  Consent to Intern Participation: N/A   Jacquelynn Cree, LRT/CTRS 01/02/2016, 2:22 PM
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
## 2016-01-02 NOTE — Progress Notes (Signed)
D:  Patient is alert and oriented on the unit this shift.  Patient denies suicidal ideation, homicidal ideation, or visual hallucinations at the present time.  Patient reports vague auditory hallucinations this am, stating he was hearing quiet voices but he "was handling it."  Patient reports that his depression is at a "5" today.  Patient states he has some mild leg pain.  Patient's goal for today is to "find a different way to control depression." A:  Emotional support and encouragement are provided.  Patient is maintained on q.15 minute safety checks.  Patient is informed to notify staff with questions or concerns. R:  Patient is cooperative with treatment plan today.  Patient is receptive, calm and cooperative on the unit at this time.  Patient interacts well with others on the unit this shift.  Patient contracts for safety at this time.  Patient remains safe at this time.
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
## 2016-01-02 NOTE — Plan of Care (Signed)
Problem: Diagnosis: Increased Risk For Suicide Attempt Goal: STG-Patient Will Report Suicidal Feelings to Staff Outcome: Progressing Patient verbally contracts for safety on the unit this shift

## 2016-01-02 NOTE — Progress Notes (Signed)
Patient ID: Alexander Drake, male   DOB: 04/29/1996, 20 y.o.   MRN: [id number] CSW made and attempt to complete a PSA with the pt, but pt requested the CSW contact the pt the following day, citing his desire to play cards.

## 2016-01-03 LAB — HIV ANTIBODY (ROUTINE TESTING W REFLEX): HIV Screen 4th Generation wRfx: NONREACTIVE

## 2016-01-03 LAB — HEPATITIS C ANTIBODY: HCV Ab: >11 s/co ratio — ABNORMAL HIGH (ref 0.0–0.9)

## 2016-01-03 LAB — HEPATITIS B CORE ANTIBODY, IGM: Hep B C IgM: NEGATIVE

## 2016-01-03 LAB — PROLACTIN: PROLACTIN: 16.4 ng/mL — AB (ref 4.0–15.2)

## 2016-01-03 MED ORDER — TRAZODONE HCL 100 MG PO TABS
150.0000 mg | ORAL_TABLET | Freq: Every day | ORAL | Status: DC
Start: 2016-01-03 — End: 2016-01-05
  Administered 2016-01-03 – 2016-01-04 (×2): 150 mg via ORAL
  Filled 2016-01-03 (×2): qty 1

## 2016-01-03 MED ORDER — ARIPIPRAZOLE 10 MG PO TABS
10.0000 mg | ORAL_TABLET | Freq: Every day | ORAL | Status: DC
  Administered 2016-01-03 – 2016-01-05 (×3): 10 mg via ORAL
  Filled 2016-01-03 (×3): qty 1

## 2016-01-03 NOTE — Progress Notes (Signed)
D: Observed pt in day room. Patient alert and oriented x4. Patient denies SI/HI. Pt endorses AH of "voices telling me negative things." Pt denies VH. Pt affect is depressed and sad. Pt stated "Day's been up and down." Pt described himself as being moody, but was appropriate and pleasant for Clinical research associate. Pt discussed "flashbacks" that occurred prior to admission as reason for self-harm. Pt rates depression 4/10 and denies anxiety.  A: Offered active listening and support. Provided therapeutic communication. Administered scheduled medications.  R: Pt pleasant and cooperative. Pt medication compliant. Will continue Q15 min. checks. Safety maintained.

## 2016-01-03 NOTE — Plan of Care (Signed)
Problem: Alteration in mood Goal: LTG-Patient reports reduction in suicidal thoughts (Patient reports reduction in suicidal thoughts and is able to verbalize a safety plan for whenever patient is feeling suicidal)  Outcome: Progressing Pt denies SI     

## 2016-01-03 NOTE — Plan of Care (Signed)
Problem: [facility] Participation in Recreation Therapeutic Interventions Goal: STG-Patient will demonstrate improved self esteem by identif STG: Self-Esteem - Within 4 treatment sessions, patient will verbalize at least 5 positive affirmation statements in each of 2 treatment sessions to increase self-esteem post d/c.  Outcome: Progressing Treatment Session 1; Completed 1 out of 2: At approximately 12:40 pm, LRT met with patient in craft room. Patient verbalized 5 positive affirmation statements. Patient reported it felt "pretty good". LRT encouraged patient to continue saying positive affirmation statements. Intervention Used: I Am statements  Leonette Monarch, LRT/CTRS 02.01.17 2:04 pm Goal: STG-Patient will identify at least five coping skills for ** STG: Coping Skills - Within 4 treatment sessions, patient will verbalize at least 5 coping skills for anger in each of 2 treatment sessions to increase anger management skills post d/c.  Outcome: Progressing Treatment Session 1; Completed 1 out of 2: At approximately 12:40 pm, LRT met with patient in craft room. Patient verbalized 5 coping skills for anger. Patient verbalized what triggers him to get angry, how his body responds to anger, and how he is going to remind himself to use his healthy coping skills. LRT provided patient with suggestions as well. Intervention Used: Coping Skills worksheet  Leonette Monarch, LRT/CTRS 02.01.17 2:06 pm
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
## 2016-01-03 NOTE — BHH Group Notes (Signed)
BHH Group Notes:  (Nursing/MHT/Case Management/Adjunct)  Date:  01/03/2016  Time:  2:04 PM  Type of Therapy:  Psychoeducational Skills  Participation Level:  None  Participation Quality:  Came in late   Marquette Old 01/03/2016, 2:04 PM

## 2016-01-03 NOTE — BHH Group Notes (Signed)
[facility] LCSW Aftercare Discharge Planning Group Note   01/03/2016 11:08 AM  Participation Quality: Active  Mood/Affect:  Appropriate  Depression Rating: Patient rated his depression at a 4 out of 10.  Anxiety Rating: Patient rated his anxiety at a 2 out of 10.  Thoughts of Suicide:  No Will you contract for safety?   NA  Current AVH:  No  Plan for Discharge/Comments: Patient attended and participated in group discussion appropriately. Patient introduced himself and identified his goal is to speak with his assigned nurse regarding his medical needs. Patient plans to return to his residence which he identifies as supportive. He is open to a referral for outpatient mental health services in [place], [place].   Transportation Means: Patient requested assistance for public transportation.  Supports: Patient is open to receiving outpatient services to address his mental health.  Jenel Lucks, CSW Intern 01/03/16  Beryl Meager, MSW, Theresia Majors 01/03/2016
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
## 2016-01-03 NOTE — [facility] Group Notes (Signed)
[facility] LCSW Group Therapy  01/03/2016 3:48 PM  Type of Therapy:  Group Therapy  Participation Level:  Active  Participation Quality:  Appropriate and Attentive  Affect:  Appropriate  Cognitive:  Alert, Appropriate and Oriented  Insight:  Engaged  Engagement in Therapy:  Engaged  Modes of Intervention:  Socialization and Support  Summary of Progress/Problems: Patient attended and participated in group discussion appropriately introducing himself and sharing during an introductory exercise that his 'superpower' is perserverance. Patient was attentive throughout group discussion and was able to identify with other group members and their experiences with experiencing difficult emotions.  Lulu Riding, MSW, LCSWA  01/03/2016, 3:48 PM

## 2016-01-03 NOTE — Progress Notes (Signed)
Recreation Therapy Notes  Date: 02.01.17 Time: 3:00 pm  Location: Craft Room  Group Topic: Self-esteem  Goal Area(s) Addresses:  Patients will write at least one positive trait about themselves. Patients will verbalize benefit of having a healthy self-esteem.  Behavioral Response: Attentive  Intervention: I Am  Activity: Patients were given a worksheet with the letter I on it and instructed to write as many positive traits inside the letter.  Education: LRT educated patients on ways they can increase their self-esteem.  Education Outcome: In group clarification offered  Clinical Observations/Feedback: Patient completed activity by writing positive traits. Patient did not contribute to group discussion.  Jacquelynn Cree, LRT/CTRS 01/03/2016 5:04 PM
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
## 2016-01-04 NOTE — [facility] Suicide Risk Assessment (Signed)
[facility] INPATIENT:  Family/Significant Other Suicide Prevention Education  Suicide Prevention Education:  Education Completed; with pt's girlfriend's mother Alexander Drake at ph: [phone],  (name of family member/significant other) has been identified by the patient as the family member/significant other with whom the patient will be residing, and identified as the person(s) who will aid the patient in the event of a mental health crisis (suicidal ideations/suicide attempt).  With written consent from the patient, the family member/significant other has been provided the following suicide prevention education, prior to the and/or following the discharge of the patient.  Completed with pt.  The suicide prevention education provided includes the following:  Suicide risk factors  Suicide prevention and interventions  National Suicide Hotline telephone number  [facility] assessment telephone number  [facility] Emergency Assistance 911  [facility] and/or Residential Mobile Crisis Unit telephone number  Request made of family/significant other to:  Remove weapons (e.g., guns, rifles, knives), all items previously/currently identified as safety concern.    Remove drugs/medications (over-the-counter, prescriptions, illicit drugs), all items previously/currently identified as a safety concern.  The family member/significant other verbalizes understanding of the suicide prevention education information provided.  The family member/significant other agrees to remove the items of safety concern listed above.  Alexander Drake 01/04/2016, 6:00 PM

## 2016-01-04 NOTE — Plan of Care (Signed)
Problem: Alteration in mood Goal: LTG-Patient reports reduction in suicidal thoughts (Patient reports reduction in suicidal thoughts and is able to verbalize a safety plan for whenever patient is feeling suicidal)  Outcome: Progressing Denies any suicidal thoughts     

## 2016-01-04 NOTE — Plan of Care (Signed)
Problem: Alteration in mood Goal: LTG-Pt's behavior demonstrates decreased signs of depression (Patient's behavior demonstrates decreased signs of depression to the point the patient is safe to return home and continue treatment in an outpatient setting)  Outcome: Progressing Patient verbalizes that he feels better today and rates his depression as a 1 although affect is depressed and flat.

## 2016-01-04 NOTE — Progress Notes (Signed)
Recreation Therapy Notes  Date: 02.02.17 Time: 3:00 pm Location: Craft Room  Group Topic: Leisure Education  Goal Area(s) Addresses:  Patient will identify things they are grateful for. Patient will identify how being grateful can influence decision making.  Behavioral Response: Attentive  Intervention: Lexicographer  Activity: Patients were given an "I Am Grateful For" worksheet and instructed to list 2-3 things they are grateful for under each category.  Education: LRT educated patients on ways they can put leisure back into their schedule.  Education Outcome: In group clarification offered  Clinical Observations/Feedback: Patient completed activity by writing down things he is grateful for. Patient did not contribute to group discussion.  Jacquelynn Cree, LRT/CTRS 01/04/2016 4:00 PM

## 2016-01-04 NOTE — Plan of Care (Signed)
Problem: [facility] Participation in Recreation Therapeutic Interventions Goal: STG-Patient will demonstrate improved self esteem by identif STG: Self-Esteem - Within 4 treatment sessions, patient will verbalize at least 5 positive affirmation statements in each of 2 treatment sessions to increase self-esteem post d/c.  Outcome: Completed/Met Date Met:  01/04/16 Treatment Session 2; Completed 2 out of 2: At approximately 12:50 pm, LRT met with patient in craft room. Patient verbalized 5 positive affirmation statements. Patient reported it felt "better than yesterday". LRT encouraged patient to continue saying positive affirmation statements. Intervention Used: I Am statements  Leonette Monarch, LRT/CTRS 02.02.17 4:26 pm Goal: STG-Patient will identify at least five coping skills for ** STG: Coping Skills - Within 4 treatment sessions, patient will verbalize at least 5 coping skills for anger in each of 2 treatment sessions to increase anger management skills post d/c.  Outcome: Completed/Met Date Met:  01/04/16 Treatment Session 2; Completed 2 out of 2: At approximately 12:50 pm, LRT met with patient in craft room. Patient verbalized 5 coping skills for anger. LRT encouraged patient to use his healthy coping skills when he felt himself getting angry to help calm himself down. Intervention Used: Coping Skills worksheet  Leonette Monarch, LRT/CTRS 02.02.17 4:28 pm

## 2016-01-04 NOTE — [facility] Group Notes (Signed)
[facility] LCSW Group Therapy  01/04/2016 3:53 PM  Type of Therapy:  Group Therapy  Participation Level:  Minimal  Participation Quality:  Attentive  Affect:  Flat  Cognitive:  Alert  Insight:  Limited  Engagement in Therapy:  Limited  Modes of Intervention:  Discussion, Education, Socialization and Support  Summary of Progress/Problems: Balance in life: Patients will discuss the concept of balance and how it looks and feels to be unbalanced. Pt will identify areas in their life that is unbalanced and ways to become more balanced. Pt attended group and stayed the entire time. He sat quietly and listened to other group members.   Daisy Floro Yarimar Lavis MSW, LCSWA  01/04/2016, 3:53 PM

## 2016-01-04 NOTE — Progress Notes (Signed)
D: Patient appears flat and depressed. He denies SI/HI. He denies voices at this time but states he had them this morning. He denies pain. He has been visible in the milieu interacting with other patients.  A: Medication given with education. Encouragement provided.  R: Patient compliant with medication. He has remained calm and cooperative. Safety maintained with 15 min checks.

## 2016-01-04 NOTE — Progress Notes (Signed)
D:  Affect flat and sad.  Rates depression as a 4 today.  Denies SI/HI/AVH.  Visible in milieu. Interacting with peers although not noted to smile.   A:  Support and encouragement offered.  Safety maintained.  Medications administered according to orders.   R:  Receptive to treatment plan.

## 2016-01-04 NOTE — [facility] Group Notes (Signed)
[facility] Group Notes:  (Nursing/MHT/Case Management/Adjunct)  Date:  01/04/2016  Time:  1:05 AM  Type of Therapy:  Group Therapy  Participation Level:  Active  Participation Quality:  Appropriate  Affect:  Appropriate  Cognitive:  Appropriate  Insight:  Good  Engagement in Group:  Engaged  Modes of Intervention:  n/a  Summary of Progress/Problems:  Alexander Drake 01/04/2016, 1:05 AM

## 2016-01-04 NOTE — Plan of Care (Signed)
Problem: Ineffective individual coping Goal: STG: Patient will remain free from self harm Outcome: Progressing Patient has remained free from harm since admission.      

## 2016-01-05 DIAGNOSIS — B1920 Unspecified viral hepatitis C without hepatic coma: Secondary | ICD-10-CM

## 2016-01-05 MED ORDER — FLUOXETINE HCL 10 MG PO CAPS: 10.0000 mg | ORAL_CAPSULE | Freq: Every day | ORAL | Status: AC

## 2016-01-05 MED ORDER — TRAZODONE HCL 150 MG PO TABS: 150.0000 mg | ORAL_TABLET | Freq: Every day | ORAL | Status: AC

## 2016-01-05 MED ORDER — ARIPIPRAZOLE 10 MG PO TABS: 10.0000 mg | ORAL_TABLET | Freq: Every day | ORAL | Status: AC

## 2016-01-05 NOTE — Progress Notes (Addendum)
Patient pleasant and cooperative, denies depression although affect is blunted. Denies SI/HI/AVH.  Verbalizes that he is glad that he is getting to go home. Able to verbalize coping skill.  Discharge instructions given.  Verbalized understanding.  Personal belongings returned.  Escorted off unit by this Clinical research associate out to security to be taken to the bus stop so he can travel home. Prescriptions faxed to [facility] pharmacy on [address].
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
## 2016-01-05 NOTE — Progress Notes (Addendum)
  [facility] Adult Case Management Discharge Plan :  Will you be returning to the same living situation after discharge:  Yes,  pt will be returning home to [facility] to live with his girlfriend and daughter At discharge, do you have transportation home?:  Do you have the ability to pay for your medications: Yes,  pt will be provided with prescriptions at discharge  Release of information consent forms completed and in the chart;  Patient's signature needed at discharge.  Patient to Follow up at: Follow-up Information    Follow up with [facility].   Why:  You will receive a call from the [facility] within 5-7 business days to schedule your appointment with Dr. Thedore Mins for your assessment for medication managment and therapy.   Contact information:   [address] [place], [place] [address] Phone: [phone] Fax: [phone]      Follow up with [facility].   Why:  Please arrive to the walk-in clinic between the hours of 12-3pm and request an assessment an assessment for outpatient substance abuse treatment, medication managment and therapy.   Contact information:   [address] [place], [place] [address] Phone: [phone] Fax: [phone]       Next level of care provider has access to [facility] Link:no  Safety Planning and Suicide Prevention discussed: Yes,  completed with pt  Have you used any form of tobacco in the last 30 days? (Cigarettes, Smokeless Tobacco, Cigars, and/or Pipes): No  Has patient been referred to the Quitline?: N/A patient is not a smoker  Patient has been referred for addiction treatment: Yes  Dorothe Pea Koray Soter 01/05/2016, 10:44 AM

## 2016-01-05 NOTE — Plan of Care (Signed)
Problem: Alteration in mood Goal: LTG-Patient reports reduction in suicidal thoughts (Patient reports reduction in suicidal thoughts and is able to verbalize a safety plan for whenever patient is feeling suicidal)  Outcome: Progressing Patient denies SI/HI.      

## 2016-01-05 NOTE — Progress Notes (Signed)
Recreation Therapy Notes  INPATIENT RECREATION TR PLAN  Patient Details Name: Alexander Drake MRN: [id number] DOB: [birth year] Today's Date: 01/05/2016  Rec Therapy Plan Is patient appropriate for Therapeutic Recreation?: Yes Treatment times per week: At least once a week TR Treatment/Interventions: 1:1 session, Group participation (Comment) (Appropriate pariticpation in daily recreation therapy tx)  Discharge Criteria Pt will be discharged from therapy if:: Treatment goals are met, Discharged Treatment plan/goals/alternatives discussed and agreed upon by:: Patient/family  Discharge Summary Short term goals set: See Care Plan Short term goals met: Complete Progress toward goals comments: One-to-one attended Which groups?: Self-esteem, Leisure education, Coping skills, Goal setting One-to-one attended: Self-esteem, anger management Reason goals not met: N/A Therapeutic equipment acquired: None Reason patient discharged from therapy: Discharge from hospital Pt/family agrees with progress & goals achieved: Yes Date patient discharged from therapy: 01/05/16   Leonette Monarch, LRT/CTRS 01/05/2016, 12:16 PM

## 2016-01-05 NOTE — Progress Notes (Signed)
D: Pt denies SI/HI/AVH. Pt is pleasant and cooperative. Pt is interacting with peers and staff appropriately.  A: Pt was offered support and encouragement. Pt was given scheduled medications. Pt was encouraged to attend groups. Q 15 minute checks were done for safety.  R:Pt attends groups and interacts well with peers and staff. Pt is taking medication. Pt has no complaints.Pt receptive to treatment and safety maintained on unit.   
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
## 2016-01-08 ENCOUNTER — Encounter (HOSPITAL_COMMUNITY): Payer: Self-pay | Admitting: Emergency Medicine

## 2016-01-08 ENCOUNTER — Emergency Department (HOSPITAL_COMMUNITY)
Admission: EM | Admit: 2016-01-08 | Discharge: 2016-01-08 | Disposition: A | Discharge status: Other Acute Inpatient Hospital | Payer: Medicaid Other | Attending: Emergency Medicine | Admitting: Emergency Medicine

## 2016-01-08 DIAGNOSIS — E119 Type 2 diabetes mellitus without complications: Secondary | ICD-10-CM | POA: Insufficient documentation

## 2016-01-08 DIAGNOSIS — Y998 Other external cause status: Secondary | ICD-10-CM | POA: Insufficient documentation

## 2016-01-08 DIAGNOSIS — X118XXA Contact with other hot tap-water, initial encounter: Secondary | ICD-10-CM | POA: Insufficient documentation

## 2016-01-08 DIAGNOSIS — Y9289 Other specified places as the place of occurrence of the external cause: Secondary | ICD-10-CM | POA: Diagnosis not present

## 2016-01-08 DIAGNOSIS — T2029XA Burn of second degree of multiple sites of head, face, and neck, initial encounter: Secondary | ICD-10-CM | POA: Diagnosis not present

## 2016-01-08 DIAGNOSIS — Z8619 Personal history of other infectious and parasitic diseases: Secondary | ICD-10-CM | POA: Insufficient documentation

## 2016-01-08 DIAGNOSIS — Z880 Allergy status to penicillin: Secondary | ICD-10-CM | POA: Insufficient documentation

## 2016-01-08 DIAGNOSIS — Y9389 Activity, other specified: Secondary | ICD-10-CM | POA: Insufficient documentation

## 2016-01-08 DIAGNOSIS — T22251A Burn of second degree of right shoulder, initial encounter: Secondary | ICD-10-CM | POA: Diagnosis not present

## 2016-01-08 DIAGNOSIS — T2009XA Burn of unspecified degree of multiple sites of head, face, and neck, initial encounter: Secondary | ICD-10-CM | POA: Diagnosis present

## 2016-01-08 DIAGNOSIS — T300 Burn of unspecified body region, unspecified degree: Secondary | ICD-10-CM

## 2016-01-08 DIAGNOSIS — T2121XA Burn of second degree of chest wall, initial encounter: Secondary | ICD-10-CM | POA: Diagnosis not present

## 2016-01-08 LAB — CBC WITH DIFFERENTIAL/PLATELET
Basophils Absolute: 0 10*3/uL (ref 0.0–0.1)
Basophils Relative: 0 %
Eosinophils Absolute: 0 10*3/uL (ref 0.0–0.7)
Eosinophils Relative: 0 %
HCT: 40.2 % (ref 39.0–52.0)
Hemoglobin: 13.8 g/dL (ref 13.0–17.0)
Lymphocytes Relative: 11 %
Lymphs Abs: 1 10*3/uL (ref 0.7–4.0)
MCH: 31.4 pg (ref 26.0–34.0)
MCHC: 34.3 g/dL (ref 30.0–36.0)
MCV: 91.4 fL (ref 78.0–100.0)
Monocytes Absolute: 0.9 10*3/uL (ref 0.1–1.0)
Monocytes Relative: 9 %
Neutro Abs: 7.6 10*3/uL (ref 1.7–7.7)
Neutrophils Relative %: 80 %
Platelets: 211 10*3/uL (ref 150–400)
RBC: 4.4 MIL/uL (ref 4.22–5.81)
RDW: 13 % (ref 11.5–15.5)
WBC: 9.6 10*3/uL (ref 4.0–10.5)

## 2016-01-08 LAB — BASIC METABOLIC PANEL
Anion gap: 14 (ref 5–15)
BUN: 11 mg/dL (ref 6–20)
CO2: 25 mmol/L (ref 22–32)
Calcium: 9.6 mg/dL (ref 8.9–10.3)
Chloride: 102 mmol/L (ref 101–111)
Creatinine, Ser: 1.27 mg/dL — ABNORMAL HIGH (ref 0.61–1.24)
GFR calc Af Amer: >60 mL/min (ref >60)
GFR calc non Af Amer: >60 mL/min (ref >60)
Glucose, Bld: 193 mg/dL — ABNORMAL HIGH (ref 65–99)
Potassium: 4.1 mmol/L (ref 3.5–5.1)
Sodium: 141 mmol/L (ref 135–145)
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
## 2016-01-19 ENCOUNTER — Encounter (HOSPITAL_COMMUNITY): Payer: Self-pay | Admitting: *Deleted

## 2016-01-19 ENCOUNTER — Emergency Department (HOSPITAL_COMMUNITY)
Admission: EM | Admit: 2016-01-19 | Discharge: 2016-01-19 | Disposition: A | Discharge status: Home / Self Care | Payer: Medicaid Other | Attending: Emergency Medicine | Admitting: Emergency Medicine

## 2016-01-19 ENCOUNTER — Telehealth (HOSPITAL_COMMUNITY): Payer: Self-pay

## 2016-01-19 DIAGNOSIS — Z8619 Personal history of other infectious and parasitic diseases: Secondary | ICD-10-CM | POA: Insufficient documentation

## 2016-01-19 DIAGNOSIS — F17210 Nicotine dependence, cigarettes, uncomplicated: Secondary | ICD-10-CM | POA: Insufficient documentation

## 2016-01-19 DIAGNOSIS — Z202 Contact with and (suspected) exposure to infections with a predominantly sexual mode of transmission: Secondary | ICD-10-CM | POA: Diagnosis present

## 2016-01-19 DIAGNOSIS — Z880 Allergy status to penicillin: Secondary | ICD-10-CM | POA: Insufficient documentation

## 2016-01-19 DIAGNOSIS — Z79899 Other long term (current) drug therapy: Secondary | ICD-10-CM | POA: Insufficient documentation

## 2016-01-19 DIAGNOSIS — R300 Dysuria: Secondary | ICD-10-CM | POA: Diagnosis not present

## 2016-01-19 DIAGNOSIS — E119 Type 2 diabetes mellitus without complications: Secondary | ICD-10-CM | POA: Insufficient documentation

## 2016-01-19 DIAGNOSIS — Z113 Encounter for screening for infections with a predominantly sexual mode of transmission: Secondary | ICD-10-CM

## 2016-01-19 LAB — URINALYSIS, ROUTINE W REFLEX MICROSCOPIC
BILIRUBIN URINE: NEGATIVE
Glucose, UA: NEGATIVE mg/dL
HGB URINE DIPSTICK: NEGATIVE
Ketones, ur: NEGATIVE mg/dL
Leukocytes, UA: NEGATIVE
Nitrite: NEGATIVE
Protein, ur: NEGATIVE mg/dL
SPECIFIC GRAVITY, URINE: 1.021 (ref 1.005–1.030)
pH: 7 (ref 5.0–8.0)

## 2016-01-19 LAB — RAPID HIV SCREEN (HIV 1/2 AB+AG)
HIV 1/2 Antibodies: NONREACTIVE
HIV-1 P24 ANTIGEN - HIV24: NONREACTIVE

## 2016-01-19 MED ORDER — CEFTRIAXONE SODIUM 250 MG IJ SOLR
250.0000 mg | Freq: Once | INTRAMUSCULAR | Status: AC
  Administered 2016-01-19: 250 mg via INTRAMUSCULAR
  Filled 2016-01-19: qty 250

## 2016-01-19 MED ORDER — LIDOCAINE HCL 1 % IJ SOLN
INTRAMUSCULAR | Status: AC
  Administered 2016-01-19: 1.5 mL
  Filled 2016-01-19: qty 20

## 2016-01-19 MED ORDER — CONDOMS LATEX LUBRICATED DEVI: 1.0000 | Status: AC | PRN

## 2016-01-19 MED ORDER — AZITHROMYCIN 250 MG PO TABS
1000.0000 mg | ORAL_TABLET | Freq: Once | ORAL | Status: AC
  Administered 2016-01-19: 1000 mg via ORAL
  Filled 2016-01-19: qty 4

## 2016-01-19 MED ORDER — METRONIDAZOLE 500 MG PO TABS
2000.0000 mg | ORAL_TABLET | Freq: Once | ORAL | Status: AC
  Administered 2016-01-19: 2000 mg via ORAL
  Filled 2016-01-19: qty 4
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
## 2016-01-19 NOTE — Discharge Instructions (Signed)
You have been seen today for painful urination and STD check. You have been treated for STDs while here in the ED. Your lab results will be called to you if they are abnormal. Follow up with PCP as needed. Return to ED should symptoms worsen. Be sure to use a condom with each sexual contact. Failure to do so may likely result and repeat infection. These infections can affect both fertility and sexual function.   Emergency Department Resource Guide 1) Find a Doctor and Pay Out of Pocket Although you won't have to find out who is covered by your insurance plan, it is a good idea to ask around and get recommendations. You will then need to call the office and see if the doctor you have chosen will accept you as a new patient and what types of options they offer for patients who are self-pay. Some doctors offer discounts or will set up payment plans for their patients who do not have insurance, but you will need to ask so you aren't surprised when you get to your appointment.  2) Contact Your Local Health Department Not all health departments have doctors that can see patients for sick visits, but many do, so it is worth a call to see if yours does. If you don't know where your local health department is, you can check in your phone book. The CDC also has a tool to help you locate your state's health department, and many state websites also have listings of all of their local health departments.  3) Find a Walk-in Clinic If your illness is not likely to be very severe or complicated, you may want to try a walk in clinic. These are popping up all over the country in pharmacies, drugstores, and shopping centers. They're usually staffed by nurse practitioners or physician assistants that have been trained to treat common illnesses and complaints. They're usually fairly quick and inexpensive. However, if you have serious medical issues or chronic medical problems, these are probably not your best option.  No  Primary Care Doctor: - Call Health Connect at  [phone] - they can help you locate a primary care doctor that  accepts your insurance, provides certain services, etc. - Physician Referral Service- [phone]  Chronic Pain Problems: Organization         Address  Phone   Notes  [facility]  [phone] Patients need to be referred by their primary care doctor.   Medication Assistance: Organization         Address  Phone   Notes  [facility] Medication [facility] [address] [place], [place] [address] [phone] --Must be a resident of [facility] -- Must have NO insurance coverage whatsoever (no Medicaid/ Medicare, etc.) -- The pt. MUST have a primary care doctor that directs their care regularly and follows them in the community   [facility]  [phone]   [employer]  [phone]    Agencies that provide inexpensive medical care: Organization         Address  Phone   Notes  [facility]  [phone]   [facility]    [phone]   [facility] [address] [place], [place] [address] [phone]   [facility] [address], [place] [phone]   Planned Parenthood    [phone]   [facility]    [phone]  [facility]  [address], [place] Phone:  [phone], Fax:  [phone] Hours of Operation:  9 am - 6 pm, M-F.  Also accepts Medicaid/Medicare and self-pay.  [facility]  [address], [place] Phone: [phone], Fax: [phone]) [phone]. Hours of Operation:  8:30 am - 5:30 pm, M-F.  Also accepts Medicaid and self-pay.  [facility] [place] [address], [place] Phone: [phone]) [phone]   [facility] [address], [place], [place] [phone], Ext. [phone] Mondays & Thursdays: 7-9 AM.  First 15 patients are seen on  a first come, first serve basis.    Medicaid-accepting [facility] Providers:  Organization         Address  Phone   Notes  [facility] [address], [place] [phone] Also accepts self-pay patients.  [facility] [address], [place]  [phone]) [phone]   [facility] [address], [place] [phone]) [phone]   [facility] [address] [place] [address], [place] [phone]) [phone]   Renaye RakersVeita Bland [address], [place]   [phone] Only accepts [place] Access [place] patients after they have their name applied to their card.   Self-Pay (no insurance) in [facility] [place]:  Organization         Address  Phone   Notes  Sickle Cell Patients, [facility] [address], [place] [phone]   [facility] [address], [place] [phone]) [phone]   [facility]  [address], [place] [phone]) [phone]   [facility]/Dr. Osei-Bonsu  [address] [place] [address], [place] or [phone], [place] [phone](336) [phone] Phone number for both [place] and [place] locations is the same.  [facility] [address], [place] [phone]) [phone]   [facility] [place] [address] [place] [address], [place] or [address] [phone]   [facility] [address], [place] [phone](336) [phone], phone; [phone]) [phone], fax Sees patients 1st and 3rd Saturday of every month.  Must not qualify for public or private insurance (i.e. Medicaid, Medicare, [place] Health Choice, Veterans' Benefits)  Household income should be no more than 200% of the poverty level The clinic cannot treat you if you are pregnant or think you are pregnant  Sexually transmitted diseases are not treated at the clinic.    Dental Care: Organization          Address  Phone  Notes  [facility] of [facility] [address], [place] [phone]) [phone] Accepts children up to age 21 who are enrolled in [place]icaid or [place] Health Choice; pregnant women with a Medicaid card; and children who have applied for Medicaid or [place] Health Choice, but were declined, whose parents can pay a reduced fee at time of service.  [facility] of [facility] [place]  [address], [place] [phone]) [phone] Accepts children up to age 21 who are enrolled in [place] or [place] Health Choice; pregnant women with a Medicaid card; and children who have applied for Medicaid or [place] Health Choice, but were declined, whose parents can pay a reduced fee at time of service.  [facility]  [phone]  [address], [place] [phone]) [phone] Patients are seen by appointment only. Walk-ins are not accepted. [facility] will see patients 21 years of age and older. Monday - Tuesday (8am-5pm) Most Wednesdays (8:30-5pm) $30 per visit, cash only  [facility]  [address], [place] [phone] Patients are seen by appointment only. Walk-ins are not accepted. [facility] will see patients 21 years of age and older. One Wednesday Evening (Monthly: Volunteer Based).  $30 per visit, cash only  [facility]  [phone] for adults; Children under age 154, call Graduate Pediatric Dentistry at [phone]. Children aged 464-14, please call [phone]) [phone] to request a pediatric application.  Dental services are provided in all areas of dental care including fillings, crowns and bridges, complete and partial dentures, implants, gum treatment, root canals, and extractions. Preventive care is also provided. Treatment is provided to both adults and children. Patients are selected via a lottery and there is often a waiting list.   [facility] [address], [place]  [phone] [email or web address]   [facility] [address], [place], [place] [phone], Ext. [phone] Second and Fourth Thursday of each month, opens at 6:30 AM; Clinic ends at 9 AM.  Patients are seen on a first-come first-served basis, and a limited number are seen during each clinic.   [facility]  [address], [place], [place] [phone]) [phone]   Eligibility Requirements You must have lived in [place], [place], or [place] counties for at least the last three months.   You cannot be eligible for state or federal sponsored National Cityhealthcare insurance, including CIGNAVeterans Administration, [place]icaid, or [employer]Medicare.   You generally cannot be eligible for healthcare insurance through your employer.    How to apply: Eligibility screenings are held every Tuesday and Wednesday afternoon from 1:00 pm until 4:00 pm. You do not need an appointment for the interview!  [facility] [address], [place], [place] [phone]   [facility]  [phone]   [facility]  [phone]   [facility]  [phone]    Behavioral Health Resources in the Community: Intensive Outpatient Programs Organization         Address  Phone  Notes  [facility] [address], [place], [place] [phone]   [facility] [address], [place], [place] [phone]   [facility] [address], [place], [place]  [phone]   [facility] [address],  [place], [place] [phone] or [phone]   Substance Abuse Resources Organization         Address  Phone  Notes  [facility]  [phone]   [facility]  [phone]   [facility]  [phone]   [facility]  [phone]   Residential & Outpatient Substance Abuse Program  [phone]   Psychological  Services Organization         Address  Phone  Notes  [facility]  [phone]- [phone]   [facility]  [phone]- [phone]   [facility] [address], [place] [phone] or [phone]    Mobile Crisis Teams Organization         Address  Phone  Notes  [facility], Mobile Crisis Care Unit  [phone]   [facility]  [address] [place], [place] [phone]  [facility] [address] [place] [phone]    Self-Help/Support Groups Organization         Address  Phone             Notes  [facility]ental [facility] Assoc[facility] of [place] - variety of support groups  336- I7437963 Call for more information  Narcotics Anonymous (NA), [facility] [address], [employer] [place]  2 meetings at this location   Statistician         Address  Phone  Notes  [facility] [address],    [place]  [phone]   [facility]  [address], [place] [address], [place], [place] [phone]   [facility] [address] [place], [place] [phone] Admissions: 8am-3pm M-F  [facility] [address],    [place], [place] [phone]   [facility] [address] [place], [place], [place] [phone]   [facility] [address]  [place], [place] [phone]   [facility] - Intensive Outpatient [address], [place], [place] [phone]   [facility] ([facility] [address]  [place], [place] [phone] or [phone]   [facility] (RTS) [address], [place], [place] [phone] Accepts Medicaid  [facility] [address],  [place] [phone] Substance Abuse/Addiction Treatment   [facility] Organization         Address  Phone  Notes  [facility]  [phone]   Angie Fava, PhD [address] [place], [place]   [phone] or [phone]   [facility] Behavioral   [address] [place], [place] [phone]   [facility] [address] [place], [place] [phone] Insurance/Medicaid/sponsorship through [facility] and Families [address]                                    [place], [place] [phone] Therapy/tele-psych/case  [facility] [address], [place] [phone]    Dr. Lolly Mustache  [phone]   [facility]  [employer] [facility] Dept. 1) [address] [place] 2) [address] [place] 3)  [address], [place] [phone]  [phone]   [facility] Child Abuse Hotline 810-674-2518 or [phone] (After Hours)

## 2016-01-19 NOTE — Telephone Encounter (Signed)
Call from lab HIV nonreactive
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
## 2016-01-20 LAB — SYPHILIS: RPR W/REFLEX TO RPR TITER AND TREPONEMAL ANTIBODIES, TRADITIONAL SCREENING AND DIAGNOSIS ALGORITHM: RPR Ser Ql: NONREACTIVE

## 2016-01-21 LAB — URINE CULTURE: CULTURE: NO GROWTH

## 2016-01-22 LAB — GC/CHLAMYDIA PROBE AMP (~~LOC~~) NOT AT ARMC
Chlamydia: NEGATIVE
NEISSERIA GONORRHEA: NEGATIVE

## 2016-03-29 ENCOUNTER — Emergency Department (HOSPITAL_COMMUNITY): Payer: Medicaid Other

## 2016-03-29 ENCOUNTER — Encounter (HOSPITAL_COMMUNITY): Payer: Self-pay | Admitting: Emergency Medicine

## 2016-03-29 ENCOUNTER — Emergency Department (HOSPITAL_COMMUNITY)
Admission: EM | Admit: 2016-03-29 | Discharge: 2016-03-29 | Disposition: A | Discharge status: Home / Self Care | Payer: Medicaid Other | Attending: Emergency Medicine | Admitting: Emergency Medicine

## 2016-03-29 DIAGNOSIS — Y998 Other external cause status: Secondary | ICD-10-CM | POA: Insufficient documentation

## 2016-03-29 DIAGNOSIS — Z79899 Other long term (current) drug therapy: Secondary | ICD-10-CM | POA: Insufficient documentation

## 2016-03-29 DIAGNOSIS — S8012XA Contusion of left lower leg, initial encounter: Secondary | ICD-10-CM | POA: Insufficient documentation

## 2016-03-29 DIAGNOSIS — Z8619 Personal history of other infectious and parasitic diseases: Secondary | ICD-10-CM | POA: Insufficient documentation

## 2016-03-29 DIAGNOSIS — F17210 Nicotine dependence, cigarettes, uncomplicated: Secondary | ICD-10-CM | POA: Diagnosis not present

## 2016-03-29 DIAGNOSIS — Y92410 Unspecified street and highway as the place of occurrence of the external cause: Secondary | ICD-10-CM | POA: Insufficient documentation

## 2016-03-29 DIAGNOSIS — E119 Type 2 diabetes mellitus without complications: Secondary | ICD-10-CM | POA: Insufficient documentation

## 2016-03-29 DIAGNOSIS — Z880 Allergy status to penicillin: Secondary | ICD-10-CM | POA: Diagnosis not present

## 2016-03-29 DIAGNOSIS — Y9389 Activity, other specified: Secondary | ICD-10-CM | POA: Insufficient documentation

## 2016-03-29 DIAGNOSIS — W19XXXA Unspecified fall, initial encounter: Secondary | ICD-10-CM

## 2016-03-29 DIAGNOSIS — S8992XA Unspecified injury of left lower leg, initial encounter: Secondary | ICD-10-CM | POA: Diagnosis present

## 2016-03-29 MED ORDER — IBUPROFEN 400 MG PO TABS
400.0000 mg | ORAL_TABLET | Freq: Once | ORAL | Status: AC
Start: 2016-03-29 — End: 2016-03-29
  Administered 2016-03-29: 400 mg via ORAL
  Filled 2016-03-29: qty 1
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
## 2016-03-29 NOTE — Discharge Instructions (Signed)
For pain control please take Ibuprofen (also known as Motrin or Advil)  (this is normally 2 over the counter pills) every 6 hours. Take with food to minimize stomach irritation.  Do not hesitate to return to the emergency room for any new, worsening or concerning symptoms.  Please obtain primary care using resource guide below. Let them know that you were seen in the emergency room and that they will need to obtain records for further outpatient management.   Contusion A contusion is a deep bruise. Contusions are the result of a blunt injury to tissues and muscle fibers under the skin. The injury causes bleeding under the skin. The skin overlying the contusion may turn blue, purple, or yellow. Minor injuries will give you a painless contusion, but more severe contusions may stay painful and swollen for a few weeks.  CAUSES  This condition is usually caused by a blow, trauma, or direct force to an area of the body. SYMPTOMS  Symptoms of this condition include:  Swelling of the injured area.  Pain and tenderness in the injured area.  Discoloration. The area may have redness and then turn blue, purple, or yellow. DIAGNOSIS  This condition is diagnosed based on a physical exam and medical history. An X-ray, CT scan, or MRI may be needed to determine if there are any associated injuries, such as broken bones (fractures). TREATMENT  Specific treatment for this condition depends on what area of the body was injured. In general, the best treatment for a contusion is resting, icing, applying pressure to (compression), and elevating the injured area. This is often called the RICE strategy. Over-the-counter anti-inflammatory medicines may also be recommended for pain control.  HOME CARE INSTRUCTIONS   Rest the injured area.  If directed, apply ice to the injured area:  Put ice in a plastic bag.  Place a towel between your skin and the bag.  Leave the ice on for 20 minutes, 2-3 times per  day.  If directed, apply light compression to the injured area using an elastic bandage. Make sure the bandage is not wrapped too tightly. Remove and reapply the bandage as directed by your health care provider.  If possible, raise (elevate) the injured area above the level of your heart while you are sitting or lying down.  Take over-the-counter and prescription medicines only as told by your health care provider. SEEK MEDICAL CARE IF:  Your symptoms do not improve after several days of treatment.  Your symptoms get worse.  You have difficulty moving the injured area. SEEK IMMEDIATE MEDICAL CARE IF:   You have severe pain.  You have numbness in a hand or foot.  Your hand or foot turns pale or cold.   This information is not intended to replace advice given to you by your health care provider. Make sure you discuss any questions you have with your health care provider.   Document Released: 08/28/2005 Document Revised: 08/09/2015 Document Reviewed: 04/05/2015 Elsevier Interactive Patient Education 2016 [employer].   [employer] Assistance The United Ways 211 is a great source of information about community services available.  Access by dialing 2-1-1 from anywhere in [place], or by website -  PooledIncome.pl.   Other Local Resources (Updated 12/2015)  Financial Assistance   Services    Phone Number and Address  [facility]  Low-cost medical care - 1st and 3rd Saturday of every month  Must not qualify for public or private insurance and must have limited  income [phone] [address] [place], [place]    [place] Department of Social Services  Child care  Emergency assistance for housing and Kimberly-Clarkutilities  Food stamps  Medicaid [phone] [address] [place], [place] [address]   [facility]  Low-cost medical care for children, communicable diseases, sexually-transmitted  diseases, immunizations, maternity care, womens health and family planning [phone] [address] [place], [place] [address]  [facility]   Medication assistance for [facility] [place] residents  Must meet income requirements [phone] [address] [place], [place].    [facility]  Child care  Emergency assistance for housing and Kimberly-Clarkutilities  Food stamps  Medicaid [phone] [address] [place], [place] [address]  [facility]   Low-cost medical care,   Monday through Friday, 9 am to 6 pm.   Accepts Medicare/Medicaid, and self-pay [phone] [address] [place], [place] [address]  [facility]  Low-cost medical care - Monday through Friday, 8:30 am - 5:30 pm  Accepts Medicaid and self-pay [phone] [address] [place], [place] [address]   [facility]  Primary medical care, including for those with sickle cell disease  Accepts Medicare, Medicaid, insurance and self-pay [phone] [address] [place], [place]  [facility]   Primary medical care  Accepts Medicare, [place], insurance and self-pay [phone] [address] [place], [place] [address]   [facility] Department of Social Services  Child care  Emergency assistance for housing and Kimberly-Clarkutilities  Food stamps  Medicaid [phone] [address] [place], [place] [address]  [facility] Department of Health and [employer]Human Services  Child care  Emergency assistance for housing and Kimberly-Clarkutilities  Food stamps  Medicaid [phone] [address] [place], [place] [address]   [facility]  Medication assistance for [facility] residents with no insurance only  Must have a primary care doctor [phone] [address] [place], [place]   [facility]   Primary medical care  [place] Medicare, [place], insurance  [phone] [address] [place], [place]  [facility]ist   Medication assistance [phone]  [facility]   Primary medical care  Accepts Medicare, [place], insurance and self-pay [phone] [address] [place], [place] [address]  [facility]   Primary medical care  Accepts Medicare, [place], insurance and self-pay [phone] [address] [place], [place] [address]  [facility]  For [place] residents between the ages of 6218 and 5564 who do not have any form of health insurance, Medicare, [place], or [place] benefits.  Services are provided free of charge to uninsured patients who fall within federal poverty guidelines.    Hours: Tuesdays and Thursdays, 4:15 - 8 pm [phone] [address], [address] [place], [place] [address]  [facility]     Primary medical care  Dental care  Nutritional counseling  Pharmacy  Accepts Medicaid, Medicare, most insurance.  Fees are adjusted based on ability to pay.   [phone] [facility] [address] [place], [place]  [phone] [facility] [address] [place], [place]  [phone] [facility] [place], [place]  [phone] [facility], [address] [place], [place]  [phone] [facility] [address] [place], [place]  [facility]  [employer] CoWomens  health and family planning [phone] [address] [place], [place]  [facility] [place] Department of Social Services  Child care  Emergency assistance for housing and [employer]  Medicaid [phone] [place], [place] [address]   [facility]    Ages 41 and older  Hours: Mondays and Thursdays, 7:00 am - 9:00 am Patients  are seen on a first come, first served basis. [phone], [phone] [place], [place]  [facility] Division of Social Services  Child care  Emergency assistance for housing and [employer]  Medicaid [phone] [address] [place], [place] [address]  [facility]  Medication assistance  Rental assistance  Food pantry  Medication assistance  Housing assistance  Emergency food distribution  Utility assistance [phone] [address] [place], [place]  [phone]  [address] [place], [place] [address] Hours: Tuesdays and Thursdays from 9am - 12 noon by appointment only  [phone] [address] [place], [place] [address]  [facility] - Lanae Boast   Accepts private insurance, [place], and [place].  Payment is based on a sliding scale for those without insurance.  Hours: Mondays, Tuesdays and Thursdays, 8:30 am - 5:30 pm.   [phone] [address] [place], [place]  [facility] - Family Medicine at [facility], [place], and [place].  Payment is based on a sliding scale for those without insurance. [phone] [address] [place], [place]  [facility] - Pediatrics at E. Scientist, research (physical sciences), [employer], and [place].  Payment is based on a sliding scale for those without insurance [phone] [address], [employer], [place]  [facility] - Pediatrics at [employer], [place], and [place].  Payment is based on a sliding scale for those without insurance. [phone] [address] [place], [place]  [facility] - Pediatrics at [facility], [place], and [place].  Payment is based on a sliding scale for those without insurance. [phone], [phone] [place], [place].    [facility]  Maternity care.  Accepts Medicaid and self-pay. [phone] [address] [place], [place]
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
## 2016-03-29 NOTE — ED Notes (Signed)
Called pt's name two times, no one came.
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
